# Patient Record
Sex: Male | Born: 1965 | Race: Black or African American | Hispanic: No | Marital: Married | Smoking: Never smoker
Health system: Southern US, Community
[De-identification: ages and names within clinical notes are randomized; demographics above are authoritative.]

## PROBLEM LIST (undated history)

## (undated) DIAGNOSIS — I10 Essential (primary) hypertension: Secondary | ICD-10-CM

## (undated) HISTORY — PX: APPENDECTOMY: SHX54

## (undated) HISTORY — PX: OTHER SURGICAL HISTORY: SHX169

---

## 1999-01-26 ENCOUNTER — Emergency Department (HOSPITAL_COMMUNITY): Admission: EM | Admit: 1999-01-26 | Discharge: 1999-01-26 | Payer: Self-pay | Admitting: Emergency Medicine

## 2002-11-13 ENCOUNTER — Emergency Department (HOSPITAL_COMMUNITY): Admission: EM | Admit: 2002-11-13 | Discharge: 2002-11-13 | Payer: Self-pay | Admitting: Emergency Medicine

## 2002-11-13 ENCOUNTER — Encounter: Payer: Self-pay | Admitting: Emergency Medicine

## 2002-12-03 ENCOUNTER — Encounter: Admission: RE | Admit: 2002-12-03 | Discharge: 2003-01-10 | Payer: Self-pay | Admitting: Occupational Medicine

## 2003-07-02 ENCOUNTER — Emergency Department (HOSPITAL_COMMUNITY): Admission: AD | Admit: 2003-07-02 | Discharge: 2003-07-02 | Payer: Self-pay | Admitting: Emergency Medicine

## 2003-08-13 ENCOUNTER — Encounter: Admission: RE | Admit: 2003-08-13 | Discharge: 2003-08-13 | Payer: Self-pay | Admitting: Orthopaedic Surgery

## 2004-09-17 IMAGING — CT CT RECONSTRUCTION
2 of 6 series · 9 of 20 positions shown, 11 images · IV contrast (omnipaque)
Comparison: none

CLINICAL DATA: History of back pain and right hip pain.
TECHNIQUE: The patient was given extensive informed consent including the risks of pain, infection, and neurologic deficits.  Specifically the risk of infection including discitis and osteomyelitis was discussed with the patient who agreed to proceed.  The patient received 1 gram of Ancef 30 minutes prior to the procedure, and 1 cc of Ancef was also added to the 20 cc of Omnipaque 180 used for the injection.

[Series 102: l-spine helical · axial · 0.27mm/px · z∈[-56,+32]mm · 6 of 101 slices shown, 8 images]
[im 15/101  soft-tissue]
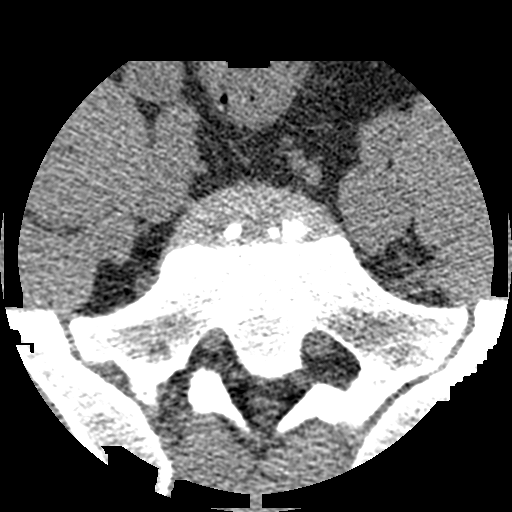
[im 15/101  bone]
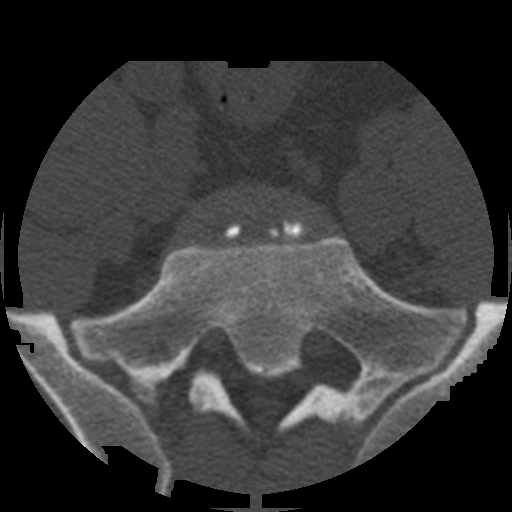
[im 29/101  bone]
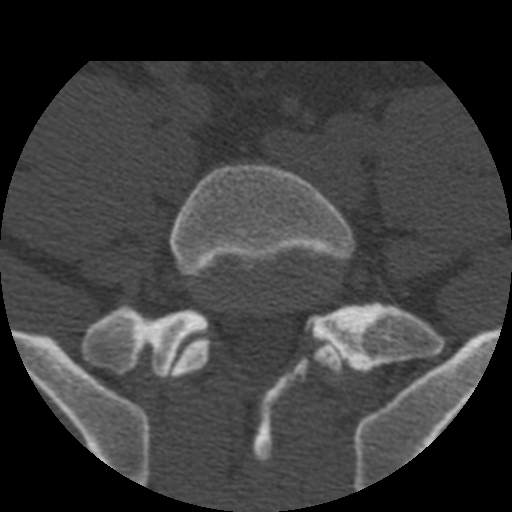
[im 43/101  bone]
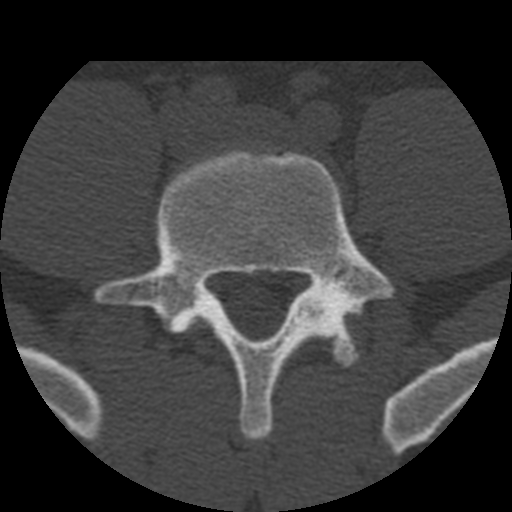
[im 58/101  bone]
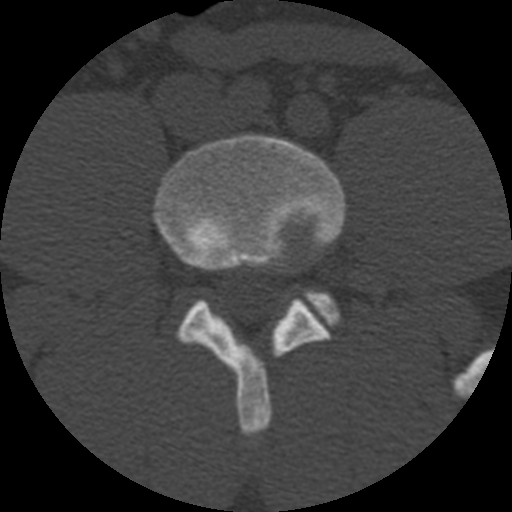
[im 72/101  soft-tissue]
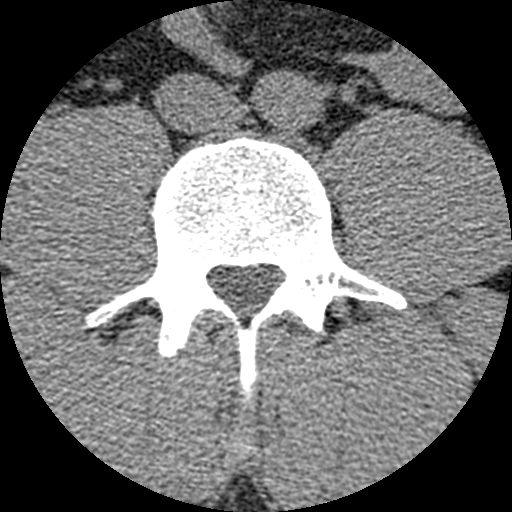
[im 72/101  bone]
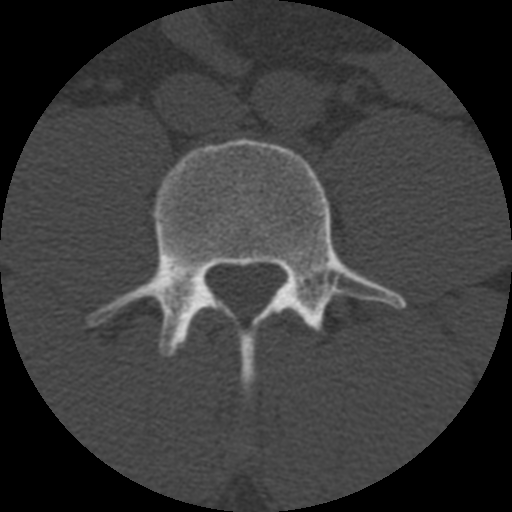
[im 86/101  bone]
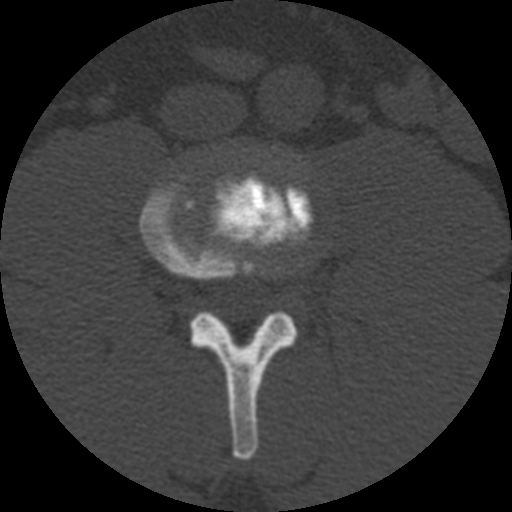

[Series 365: reformatted · sagittal · 0.27mm/px · 3 of 40 slices shown]
[im 8/40  bone]
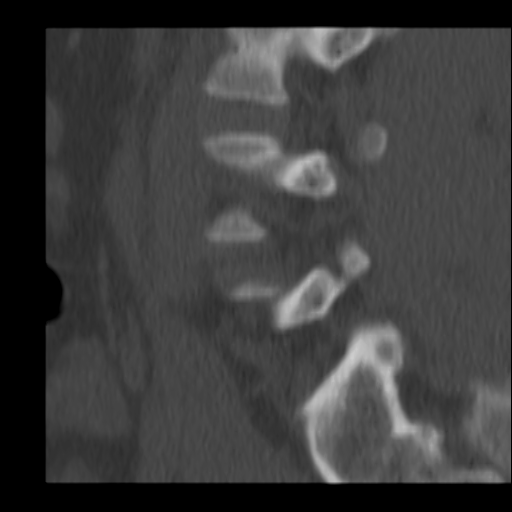
[im 16/40  bone]
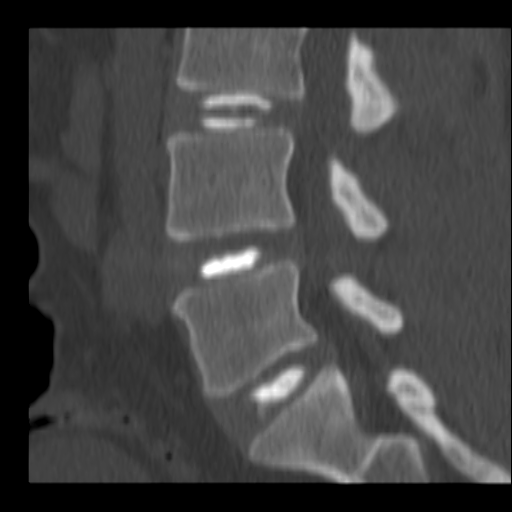
[im 24/40  bone]
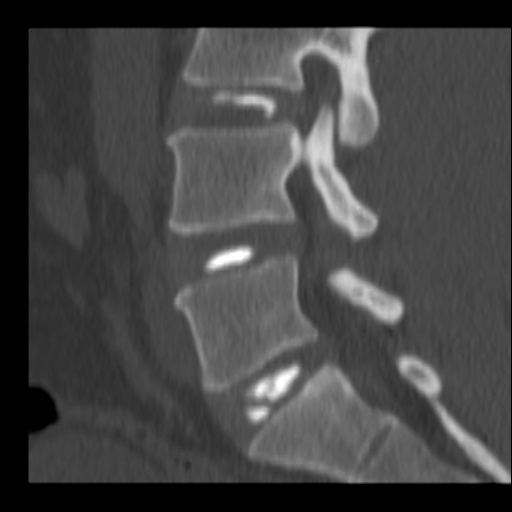

[9 of 20 positions shown; findings below may reference images not displayed]

The patient's back was scrubbed with a sterile scrub sponge for 5 minutes followed by copious application of Betadine solution.  Strict sterile technique was used by everyone in the room including cap, mask, gown, and sterile drapes.  The patient received preprocedure sedation using 2 mg of Versed IV.  A left paraspinous approach was taken with 22 gauge 15 cm Luiyi Foerster using 1% lidocaine.  A transdural approach was necessitated by the patient's anatomy at L5-S1.  Results are as follows:
LUMBAR DISKOGRAMS
L3-4:  Opening pressure 20 PSI.  Pressure at pain response at 60 PSI.  Quantity of contrast 2.5 mL.  Level of pain was 7 on a scale of 1 to 10 with 10 being most severe.  His pain is in the center of his back.  There is more of a pressure sensation and was not familiar to the patient.

L4-5:  Opening pressure 20 PSI.  Pressure at pain response at 60 PSI.  Quantity of contrast 2.5 mL.  Level of pain was 8 on a scale of 1 to 10.  He experienced pressure on the right side of his back similar to that which he experienced at home.

L5-S1:  Opening pressure 40 PSI.  Pressure pain response at 50 PSI.  Quantity of contrast 2.5 mL.  Level of pain was 10 on a scale of 1 to 10.  He experienced pain extending into the right hip beginning in the center of his back.  This was identical to that which he experienced at home.

The discs were all morphologically normal.
POST-DISKOGRAM CT AND MULTIPLANAR RECONSTRUCTION
The lowest three disc spaces are examined.
L3-4:  Normal disc space.
L4-5:  Normal disc space.
L5-S1:  Normal disc space.
IMPRESSION
1.Significant concordant reproduction of the patient's pain at L5-S1 extending to the right hip.
2.Similar, but not quite concordant pain at L4-5.
3.Pressure sensation at L3-4, but no reproduction of the patient's typical pain.
4.Discs all appeared morphologically normal without evidence for annular rent or protrusion of contrast or disc material into the spinal canal.

## 2005-01-07 ENCOUNTER — Emergency Department (HOSPITAL_COMMUNITY): Admission: EM | Admit: 2005-01-07 | Discharge: 2005-01-07 | Payer: Self-pay | Admitting: Emergency Medicine

## 2006-02-22 ENCOUNTER — Emergency Department (HOSPITAL_COMMUNITY): Admission: EM | Admit: 2006-02-22 | Discharge: 2006-02-22 | Payer: Self-pay | Admitting: *Deleted

## 2009-12-20 ENCOUNTER — Emergency Department (HOSPITAL_COMMUNITY): Admission: EM | Admit: 2009-12-20 | Discharge: 2009-12-20 | Payer: Self-pay | Admitting: Family Medicine

## 2010-01-28 ENCOUNTER — Emergency Department (HOSPITAL_BASED_OUTPATIENT_CLINIC_OR_DEPARTMENT_OTHER): Admission: EM | Admit: 2010-01-28 | Discharge: 2010-01-28 | Payer: Self-pay | Admitting: Emergency Medicine

## 2010-01-28 ENCOUNTER — Ambulatory Visit: Payer: Self-pay | Admitting: Radiology

## 2019-12-25 ENCOUNTER — Ambulatory Visit: Payer: Self-pay | Attending: Internal Medicine

## 2019-12-25 DIAGNOSIS — Z20822 Contact with and (suspected) exposure to covid-19: Secondary | ICD-10-CM

## 2019-12-26 LAB — NOVEL CORONAVIRUS, NAA: SARS-CoV-2, NAA: DETECTED — AB

## 2022-08-12 ENCOUNTER — Ambulatory Visit (INDEPENDENT_AMBULATORY_CARE_PROVIDER_SITE_OTHER): Payer: 59 | Admitting: Podiatry

## 2022-08-12 DIAGNOSIS — Q666 Other congenital valgus deformities of feet: Secondary | ICD-10-CM | POA: Diagnosis not present

## 2022-08-12 NOTE — Progress Notes (Unsigned)
  Subjective:  Patient ID: Alexander Mejia, male    DOB: Aug 18, 1966,  MRN: [id number]  Chief Complaint  Patient presents with   Callouses    Right foot underneath 5th toe     56 y.o. male presents with the above complaint.  Patient presents with right submetatarsal 5 porokeratotic lesion painful to touch is progressive gotten worse worse with ambulation hurts with pressure.  He does a lot on his foot.  He has not seen and was prior to seeing me.  He states that he also has flatfoot deformity if he does not wear any kind of orthotics he wears okay shoes.  He wanted to discuss treatment options for that as well   Review of Systems: Negative except as noted in the HPI. Denies N/V/F/Ch.  No past medical history on file.  Current Outpatient Medications:    amLODipine (NORVASC) 10 MG tablet, Take 10 mg by mouth daily., Disp: , Rfl:    calcium carbonate (TUMS - DOSED IN MG ELEMENTAL CALCIUM) 500 MG chewable tablet, Chew by mouth., Disp: , Rfl:   Social History   Tobacco Use  Smoking Status Not on file  Smokeless Tobacco Not on file    Not on File Objective:  There were no vitals filed for this visit. There is no height or weight on file to calculate BMI. Constitutional Well developed. Well nourished.  Vascular Dorsalis pedis pulses palpable bilaterally. Posterior tibial pulses palpable bilaterally. Capillary refill normal to all digits.  No cyanosis or clubbing noted. Pedal hair growth normal.  Neurologic Normal speech. Oriented to person, place, and time. Epicritic sensation to light touch grossly present bilaterally.  Dermatologic Right submetatarsal 5 porokeratotic lesions with underlying plantarflexed fifth metatarsal.  Pain on palpation to the lesion.  Gait examination shows pes planovalgus deformity with calcaneovalgus to many toe signs unable to recruit the arch with dorsiflexion of the hallux.  Orthopedic: Normal joint ROM without pain or crepitus bilaterally. No visible  deformities. No bony tenderness.   Radiographs: None Assessment:   1. Pes planovalgus    Plan:  Patient was evaluated and treated and all questions answered.  Bilateral submetatarsal 5 porokeratosis with underlying pes planovalgus deformity -All questions and concerns were discussed with the patient in extensive detail.  Given the amount of left foot deformity that is present patient will benefit from custom-made orthotics to control ankle motion support the arch of the foot take the pressure away from the arches and heel as well as submetatarsal 5.  I discussed with patient he states understand would like to proceed with getting orthotics -He was casted for orthotics -The lesions were also debrided down using chisel blade to handle.  No pinpoint bleeding noted no complication noted.  No follow-ups on file.  Right submet 5 plantarflexed metatarsal porokeratosis  Pes planovalgus orthotics

## 2022-09-02 ENCOUNTER — Telehealth: Payer: Self-pay | Admitting: Podiatry

## 2022-09-02 NOTE — Telephone Encounter (Signed)
Left message on vm that orthotics are in , if he would like an earlier appt he can give [place] a call back

## 2022-09-30 ENCOUNTER — Ambulatory Visit (INDEPENDENT_AMBULATORY_CARE_PROVIDER_SITE_OTHER): Payer: 59

## 2022-09-30 DIAGNOSIS — Q666 Other congenital valgus deformities of feet: Secondary | ICD-10-CM

## 2022-11-27 NOTE — Progress Notes (Signed)
Patient presents today to pick up custom molded foot orthotics recommended by Dr. Posey Pronto.   Orthotics were dispensed and fit was satisfactory. Reviewed instructions for break-in and wear. Written instructions given to patient.  Patient will follow up as needed.

## 2024-01-24 ENCOUNTER — Other Ambulatory Visit: Payer: Self-pay

## 2024-01-24 ENCOUNTER — Emergency Department (HOSPITAL_COMMUNITY): Payer: Worker's Compensation

## 2024-01-24 ENCOUNTER — Emergency Department (HOSPITAL_COMMUNITY)
Admission: EM | Admit: 2024-01-24 | Discharge: 2024-01-25 | Disposition: A | Discharge status: Home / Self Care | Payer: Worker's Compensation | Attending: Emergency Medicine | Admitting: Emergency Medicine

## 2024-01-24 ENCOUNTER — Encounter (HOSPITAL_COMMUNITY): Payer: Self-pay

## 2024-01-24 DIAGNOSIS — S4991XA Unspecified injury of right shoulder and upper arm, initial encounter: Secondary | ICD-10-CM | POA: Diagnosis present

## 2024-01-24 DIAGNOSIS — Y990 Civilian activity done for income or pay: Secondary | ICD-10-CM | POA: Insufficient documentation

## 2024-01-24 DIAGNOSIS — S4351XA Sprain of right acromioclavicular joint, initial encounter: Secondary | ICD-10-CM | POA: Diagnosis not present

## 2024-01-24 DIAGNOSIS — X501XXA Overexertion from prolonged static or awkward postures, initial encounter: Secondary | ICD-10-CM | POA: Insufficient documentation

## 2024-01-24 NOTE — ED Provider Notes (Signed)
 [place] EMERGENCY DEPARTMENT AT [facility] Provider Note   CSN: [id number] Arrival date & time: 01/24/24  2027     History  Chief Complaint  Patient presents with   Shoulder Injury   Arm Injury    Alexander Mejia is a 58 y.o. male with no past medical history presented with right arm pain after trying to catch an object at work.  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  Patient states since has not been able to move his right arm.  Patient was given fentanyl by EMS which seems to help.  Patient still feel move his hand but does have a shooting pain down his hand.  Patient denies any open wounds but states the pain is in his right arm and sometimes his right elbow but is able to range his right elbow and hand.  Patient does have history of rotator cuff surgery.    Home Medications Prior to Admission medications   Medication Sig Start Date End Date Taking? Authorizing Provider  amLODipine (NORVASC) 10 MG tablet Take 10 mg by mouth daily. 08/05/22   [provider]  calcium carbonate (TUMS - DOSED IN MG ELEMENTAL CALCIUM) 500 MG chewable tablet Chew by mouth.    [provider]      Allergies    Patient has no allergy information on record.    Review of Systems   Review of Systems  Physical Exam Updated Vital Signs Pulse 62   Temp 98 F (36.7 C) (Oral)   Resp 18   Ht 5\' 10"  (1.778 m)   Wt 97.1 kg   SpO2 99%   BMI 30.71 kg/m  Physical Exam Vitals reviewed.  Constitutional:      General: He is not in acute distress. Cardiovascular:     Rate and Rhythm: Normal rate.     Pulses: Normal pulses.  Musculoskeletal:     Comments: Right shoulder: Positive piano sign in the shoulder the [facility] joint, 5 out of 5 right elbow flexion/extension, grip strength, unable to range right shoulder due to pain, no signs of open wounds Pain not out of proportion Soft compartments   Skin:    General: Skin is warm and dry.     Capillary Refill: Capillary refill takes less than 2 seconds.  Neurological:     Mental Status: He is alert.     Comments: Sensation intact distally  Psychiatric:        Mood and Affect: Mood normal.     ED Results / Procedures / Treatments   Labs (all labs ordered are listed, but only abnormal results are displayed) Labs Reviewed - No data to display  EKG None  Radiology DG Shoulder Right Result Date: 01/24/2024 CLINICAL DATA:  Pain, heard a snap. EXAM: RIGHT SHOULDER - 2+ VIEW COMPARISON:  Remote shoulder radiograph 01/18/2010 FINDINGS: No acute fracture. No glenohumeral dislocation. There is widening of the acromioclavicular joint that is new from remote prior exam, acuity uncertain. Subcortical cystic change in the lateral humeral head. Frank erosions. IMPRESSION: 1. Widening of the acromioclavicular joint, acuity uncertain, new from remote 2011 exam. 2. No acute fracture. Electronically Signed   By: Narda Rutherford M.D.   On: 01/24/2024 22:58   DG Elbow Complete Right Result Date: 01/24/2024 CLINICAL DATA:  Pain, heard is snap.  Limited range of motion. EXAM: RIGHT ELBOW - COMPLETE 3+ VIEW COMPARISON:  None Available. FINDINGS: There is no evidence of fracture, dislocation, or joint effusion. Minor degenerative spurring of the coronoid. Soft tissues are unremarkable. IMPRESSION: No fracture or subluxation of the right elbow. Minor degenerative spurring of the coronoid. Electronically Signed   By: Narda Rutherford M.D.   On: 01/24/2024 22:52    Procedures Procedures    Medications Ordered in ED Medications - No data to display  ED Course/ Medical Decision Making/ A&P                                 Medical Decision Making Amount and/or Complexity of Data Reviewed Radiology: ordered.   Alexander Mejia 58 y.o. presented today for right shoulder pain. Working DDx that I considered at this time includes, but not limited to, [facility]  injury, contusion, strain/sprain, fracture, dislocation, neurovascular compromise, septic joint, ischemic limb, compartment syndrome.  R/o DDx: contusion, strain/sprain, fracture, dislocation, neurovascular compromise, septic joint, ischemic limb, compartment syndrome: These are considered less likely due to history of present illness, physical exam, labs/imaging findings.  Review of prior external notes: 05/23/2023 office visit  Unique Tests and My Independent Interpretation:  Right elbow x-ray: No acute pathology Right shoulder x-ray: Widened AC joint  Social Determinants of Health: none  Discussion with Independent Historian:  Wife  Discussion of Management of Tests: None  Risk: Medium: prescription drug management  Risk Stratification Score: None  Plan: On exam patient was no acute distress with stable vitals.  On exam patient does have a positive piano key sign.  Patient it sounds like had a distraction injury in which an object pulled his right arm and so do suspect possible AC injury.  Patient is neuro vastly intact otherwise.  Patient is resting comfortably after the fentanyl was given in by EMS and not requiring any pain meds here.  X-ray by my independent interpretation does show a widened AC joint and so I do feel he has a tear there.  Will give him sling and orthopedic follow-up. I spoke to the patient about RICE therapy including Tylenol every 6 hours needed pain, ice 3-4 times daily for 15 minutes at a time, elevation of extremity.  Work note given.  I spoke to the patient about not lifting heavy objects and waiting until he sees orthopedics to see what the next steps will be.  At time of discharge patient was given sling and pain was well-controlled and compartments are soft.  Patient was given return precautions. Patient stable for discharge at this time.  Patient verbalized understanding of plan.  This chart was dictated using voice recognition software.  Despite best  efforts to proofread,  errors can occur which can change the documentation meaning.         Final Clinical Impression(s) / ED Diagnoses Final diagnoses:  Sprain of right acromioclavicular ligament, initial encounter    Rx / DC Orders ED Discharge Orders     None         Netta Corrigan, PA-C 01/24/24 2340    Franne Forts, DO 01/24/24 2358

## 2024-01-24 NOTE — Discharge Instructions (Signed)
 Please follow-up with the orthopedist have attached here for you today.  Today appears that you have sprained your ACL ligament in your right shoulder causing her pain.  You are given a sling and you may take Tylenol or ibuprofen every 6 hours needed for pain along with ice and follow-up with orthopedics.  Please do not lift any heavy objects in the meantime.  If symptoms change or worsen please return to the ER.

## 2024-01-24 NOTE — ED Triage Notes (Signed)
 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 Hx of rotator cuff surgery.  Med: 200 mcg of fentanyl total. VitalBP 179/110 HR 64; [place] 2L:

## 2024-02-02 ENCOUNTER — Other Ambulatory Visit: Payer: Self-pay | Admitting: Physician Assistant

## 2024-02-02 DIAGNOSIS — S43401A Unspecified sprain of right shoulder joint, initial encounter: Secondary | ICD-10-CM

## 2024-02-04 ENCOUNTER — Ambulatory Visit
Admission: RE | Admit: 2024-02-04 | Discharge: 2024-02-04 | Disposition: A | Discharge status: Home / Self Care | Payer: Worker's Compensation | Source: Ambulatory Visit | Attending: Physician Assistant | Admitting: Physician Assistant

## 2024-02-04 DIAGNOSIS — S43401A Unspecified sprain of right shoulder joint, initial encounter: Secondary | ICD-10-CM

## 2024-03-08 ENCOUNTER — Other Ambulatory Visit (HOSPITAL_COMMUNITY): Payer: Self-pay | Admitting: Family Medicine

## 2024-03-08 DIAGNOSIS — E7800 Pure hypercholesterolemia, unspecified: Secondary | ICD-10-CM

## 2024-03-13 ENCOUNTER — Ambulatory Visit (HOSPITAL_BASED_OUTPATIENT_CLINIC_OR_DEPARTMENT_OTHER)
Admission: RE | Admit: 2024-03-13 | Discharge: 2024-03-13 | Disposition: A | Discharge status: Home / Self Care | Payer: Self-pay | Source: Ambulatory Visit | Attending: Family Medicine | Admitting: Family Medicine

## 2024-03-13 DIAGNOSIS — E7800 Pure hypercholesterolemia, unspecified: Secondary | ICD-10-CM | POA: Insufficient documentation

## 2024-03-22 ENCOUNTER — Other Ambulatory Visit: Payer: Self-pay | Admitting: Orthopedic Surgery

## 2024-03-22 DIAGNOSIS — G8929 Other chronic pain: Secondary | ICD-10-CM

## 2024-04-02 ENCOUNTER — Ambulatory Visit
Admission: RE | Admit: 2024-04-02 | Discharge: 2024-04-02 | Disposition: A | Discharge status: Home / Self Care | Payer: Worker's Compensation | Source: Ambulatory Visit | Attending: Orthopedic Surgery | Admitting: Orthopedic Surgery

## 2024-04-02 DIAGNOSIS — G8929 Other chronic pain: Secondary | ICD-10-CM

## 2024-07-01 NOTE — Progress Notes (Signed)
 Anesthesia Review:  PCP: Alm Rav  Cardiologist :  PPM/ ICD: Device Orders: Rep Notified:  Chest x-ray : EKG : CT Card-04/09/24  Echo : Stress test: Cardiac Cath :   Activity level:  Sleep Study/ CPAP : Fasting Blood Sugar :      / Checks Blood Sugar -- times a day:    Blood Thinner/ Instructions /Last Dose: ASA / Instructions/ Last Dose :

## 2024-07-01 NOTE — Patient Instructions (Signed)
 SURGICAL WAITING ROOM VISITATION  Patients having surgery or a procedure may have no more than 2 support people in the waiting area - these visitors may rotate.    Children under the age of 3 must have an adult with them who is not the patient.  Visitors with respiratory illnesses are discouraged from visiting and should remain at home.  If the patient needs to stay at the hospital during part of their recovery, the visitor guidelines for inpatient rooms apply. Pre-op nurse will coordinate an appropriate time for 1 support person to accompany patient in pre-op.  This support person may not rotate.    Please refer to the [facility] website for the visitor guidelines for Inpatients (after your surgery is over and you are in a regular room).       Your procedure is scheduled on:  07/12/24    Report to [facility] Main Entrance    Report to admitting at  1030 AM   Call this number if you have problems the morning of surgery [phone]   Do not eat food :After Midnight.   After Midnight you may have the following liquids until __  1000____ AMDAY OF SURGERY  Water Non-Citrus Juices (without pulp, NO RED-Apple, White grape, White cranberry) Black Coffee (NO MILK/CREAM OR CREAMERS, sugar ok)  Clear Tea (NO MILK/CREAM OR CREAMERS, sugar ok) regular and decaf                             Plain Jell-O (NO RED)                                           Fruit ices (not with fruit pulp, NO RED)                                     Popsicles (NO RED)                                                               Sports drinks like Gatorade (       The day of surgery:  Drink ONE (1) Pre-Surgery Clear Ensure or G2 at   1000AM the morning of surgery. Drink in one sitting. Do not sip.  This drink was given to you during your hospital  pre-op appointment visit. Nothing else to drink after completing the  Pre-Surgery Clear Ensure or G2.          If you have questions, please contact  your surgeon's office.       Oral Hygiene is also important to reduce your risk of infection.                                    Remember - BRUSH YOUR TEETH THE MORNING OF SURGERY WITH YOUR REGULAR TOOTHPASTE  DENTURES WILL BE REMOVED PRIOR TO SURGERY PLEASE DO NOT APPLY Poly grip OR ADHESIVES!!!   Do NOT smoke after Midnight   Stop all vitamins and  herbal supplements 7 days before surgery.   Take these medicines the morning of surgery with A SIP OF WATER:  amlodipine   DO NOT TAKE ANY ORAL DIABETIC MEDICATIONS DAY OF YOUR SURGERY  Bring CPAP mask and tubing day of surgery.                              You may not have any metal on your body including hair pins, jewelry, and body piercing             Do not wear make-up, lotions, powders, perfumes/cologne, or deodorant  Do not wear nail polish including gel and S&S, artificial/acrylic nails, or any other type of covering on natural nails including finger and toenails. If you have artificial nails, gel coating, etc. that needs to be removed by a nail salon please have this removed prior to surgery or surgery may need to be canceled/ delayed if the surgeon/ anesthesia feels like they are unable to be safely monitored.   Do not shave  48 hours prior to surgery.               Men may shave face and neck.   Do not bring valuables to the hospital. [facility] IS NOT             RESPONSIBLE   FOR VALUABLES.   Contacts, glasses, dentures or bridgework may not be worn into surgery.   Bring small overnight bag day of surgery.   DO NOT BRING YOUR HOME MEDICATIONS TO THE HOSPITAL. PHARMACY WILL DISPENSE MEDICATIONS LISTED ON YOUR MEDICATION LIST TO YOU DURING YOUR ADMISSION IN THE HOSPITAL!    Patients discharged on the day of surgery will not be allowed to drive home.  Someone NEEDS to stay with you for the first 24 hours after anesthesia.   Special Instructions: Bring a copy of your healthcare power of attorney and living will  documents the day of surgery if you haven't scanned them before.              Please read over the following fact sheets you were given: IF YOU HAVE QUESTIONS ABOUT YOUR PRE-OP INSTRUCTIONS PLEASE CALL [phone].   If you received a COVID test during your pre-op visit  it is requested that you wear a mask when out in public, stay away from anyone that may not be feeling well and notify your surgeon if you develop symptoms. If you test positive for Covid or have been in contact with anyone that has tested positive in the last 10 days please notify you surgeon.      Pre-operative 5 CHG Bath Instructions   You can play a key role in reducing the risk of infection after surgery. Your skin needs to be as free of germs as possible. You can reduce the number of germs on your skin by washing with CHG (chlorhexidine gluconate) soap before surgery. CHG is an antiseptic soap that kills germs and continues to kill germs even after washing.   DO NOT use if you have an allergy to chlorhexidine/CHG or antibacterial soaps. If your skin becomes reddened or irritated, stop using the CHG and notify one of our RNs at [phone].   Please shower with the CHG soap starting 4 days before surgery using the following schedule:     Please keep in mind the following:  DO NOT shave, including legs and underarms, starting the day of your first  shower.   You may shave your face at any point before/day of surgery.  Place clean sheets on your bed the day you start using CHG soap. Use a clean washcloth (not used since being washed) for each shower. DO NOT sleep with pets once you start using the CHG.   CHG Shower Instructions:  If you choose to wash your hair and private area, wash first with your normal shampoo/soap.  After you use shampoo/soap, rinse your hair and body thoroughly to remove shampoo/soap residue.  Turn the water OFF and apply about 3 tablespoons (45 ml) of CHG soap to a CLEAN washcloth.  Apply CHG  soap ONLY FROM YOUR NECK DOWN TO YOUR TOES (washing for 3-5 minutes)  DO NOT use CHG soap on face, private areas, open wounds, or sores.  Pay special attention to the area where your surgery is being performed.  If you are having back surgery, having someone wash your back for you may be helpful. Wait 2 minutes after CHG soap is applied, then you may rinse off the CHG soap.  Pat dry with a clean towel  Put on clean clothes/pajamas   If you choose to wear lotion, please use ONLY the CHG-compatible lotions on the back of this paper.     Additional instructions for the day of surgery: DO NOT APPLY any lotions, deodorants, cologne, or perfumes.   Put on clean/comfortable clothes.  Brush your teeth.  Ask your nurse before applying any prescription medications to the skin.      CHG Compatible Lotions   Aveeno Moisturizing lotion  Cetaphil Moisturizing Cream  Cetaphil Moisturizing Lotion  Clairol Herbal Essence Moisturizing Lotion, Dry Skin  Clairol Herbal Essence Moisturizing Lotion, Extra Dry Skin  Clairol Herbal Essence Moisturizing Lotion, Normal Skin  Curel Age Defying Therapeutic Moisturizing Lotion with Alpha Hydroxy  Curel Extreme Care Body Lotion  Curel Soothing Hands Moisturizing Hand Lotion  Curel Therapeutic Moisturizing Cream, Fragrance-Free  Curel Therapeutic Moisturizing Lotion, Fragrance-Free  Curel Therapeutic Moisturizing Lotion, Original Formula  Eucerin Daily Replenishing Lotion  Eucerin Dry Skin Therapy Plus Alpha Hydroxy Crme  Eucerin Dry Skin Therapy Plus Alpha Hydroxy Lotion  Eucerin Original Crme  Eucerin Original Lotion  Eucerin Plus Crme Eucerin Plus Lotion  Eucerin TriLipid Replenishing Lotion  Keri Anti-Bacterial Hand Lotion  Keri Deep Conditioning Original Lotion Dry Skin Formula Softly Scented  Keri Deep Conditioning Original Lotion, Fragrance Free Sensitive Skin Formula  Keri Lotion Fast Absorbing Fragrance Free Sensitive Skin Formula  Keri  Lotion Fast Absorbing Softly Scented Dry Skin Formula  Keri Original Lotion  Keri Skin Renewal Lotion Keri Silky Smooth Lotion  Keri Silky Smooth Sensitive Skin Lotion  Nivea Body Creamy Conditioning Oil  Nivea Body Extra Enriched Lotion  Nivea Body Original Lotion  Nivea Body Sheer Moisturizing Lotion Nivea Crme  Nivea Skin Firming Lotion  NutraDerm 30 Skin Lotion  NutraDerm Skin Lotion  NutraDerm Therapeutic Skin Cream  NutraDerm Therapeutic Skin Lotion  ProShield Protective Hand Cream  Provon moisturizing lotion  [place]- Preparing for Total Shoulder Arthroplasty    Before surgery, you can play an important role. Because skin is not sterile, your skin needs to be as free of germs as possible. You can reduce the number of germs on your skin by using the following products. Benzoyl Peroxide Gel Reduces the number of germs present on the skin Applied twice a day to shoulder area starting two days before surgery    ==================================================================  Please follow these instructions carefully:  BENZOYL PEROXIDE 5% GEL  Please do not use if you have an allergy to benzoyl peroxide.   If your skin becomes reddened/irritated stop using the benzoyl peroxide.  Starting two days before surgery, apply as follows: Apply benzoyl peroxide in the morning and at night. Apply after taking a shower. If you are not taking a shower clean entire shoulder front, back, and side along with the armpit with a clean wet washcloth.  Place a quarter-sized dollop on your shoulder and rub in thoroughly, making sure to cover the front, back, and side of your shoulder, along with the armpit.   2 days before ____ AM   ____ PM              1 day before ____ AM   ____ PM                         Do this twice a day for two days.  (Last application is the night before surgery, AFTER using the CHG soap as described below).  Do NOT apply benzoyl peroxide gel on the day of  surgery.

## 2024-07-03 ENCOUNTER — Encounter (HOSPITAL_COMMUNITY): Payer: Self-pay

## 2024-07-03 ENCOUNTER — Other Ambulatory Visit: Payer: Self-pay

## 2024-07-03 ENCOUNTER — Encounter (HOSPITAL_COMMUNITY)
Admission: RE | Admit: 2024-07-03 | Discharge: 2024-07-03 | Disposition: A | Discharge status: Home / Self Care | Payer: Worker's Compensation | Source: Ambulatory Visit | Attending: Orthopedic Surgery | Admitting: Orthopedic Surgery

## 2024-07-03 VITALS — BP 149/74 | HR 70 | Temp 98.4°F | Resp 16 | Ht 70.0 in | Wt 212.0 lb

## 2024-07-03 DIAGNOSIS — Z01818 Encounter for other preprocedural examination: Secondary | ICD-10-CM | POA: Diagnosis not present

## 2024-07-03 DIAGNOSIS — Z01810 Encounter for preprocedural cardiovascular examination: Secondary | ICD-10-CM | POA: Diagnosis present

## 2024-07-03 DIAGNOSIS — Z01812 Encounter for preprocedural laboratory examination: Secondary | ICD-10-CM | POA: Diagnosis present

## 2024-07-03 HISTORY — DX: Essential (primary) hypertension: I10

## 2024-07-03 LAB — BASIC METABOLIC PANEL WITH GFR
Anion gap: 13 (ref 5–15)
BUN: 15 mg/dL (ref 6–20)
CO2: 23 mmol/L (ref 22–32)
Calcium: 10.3 mg/dL (ref 8.9–10.3)
Chloride: 103 mmol/L (ref 98–111)
Creatinine, Ser: 0.87 mg/dL (ref 0.61–1.24)
GFR, Estimated: >60 mL/min (ref >60)
Glucose, Bld: 127 mg/dL — ABNORMAL HIGH (ref 70–99)
Potassium: 4.4 mmol/L (ref 3.5–5.1)
Sodium: 139 mmol/L (ref 135–145)

## 2024-07-03 LAB — CBC
HCT: 45.2 % (ref 39.0–52.0)
Hemoglobin: 14.3 g/dL (ref 13.0–17.0)
MCH: 27.2 pg (ref 26.0–34.0)
MCHC: 31.6 g/dL (ref 30.0–36.0)
MCV: 85.9 fL (ref 80.0–100.0)
Platelets: 336 K/uL (ref 150–400)
RBC: 5.26 MIL/uL (ref 4.22–5.81)
RDW: 12.6 % (ref 11.5–15.5)
WBC: 6.4 K/uL (ref 4.0–10.5)
nRBC: 0 % (ref 0.0–0.2)

## 2024-07-03 LAB — SURGICAL PCR SCREEN
MRSA, PCR: NEGATIVE
Staphylococcus aureus: NEGATIVE

## 2024-07-12 ENCOUNTER — Ambulatory Visit (HOSPITAL_COMMUNITY): Payer: Worker's Compensation | Admitting: Physician Assistant

## 2024-07-12 ENCOUNTER — Ambulatory Visit (HOSPITAL_COMMUNITY): Payer: Worker's Compensation | Admitting: Certified Registered Nurse Anesthetist

## 2024-07-12 ENCOUNTER — Encounter (HOSPITAL_COMMUNITY)
Admission: RE | Disposition: A | Discharge status: Home / Self Care | Payer: Self-pay | Source: Home / Self Care | Attending: Orthopedic Surgery

## 2024-07-12 ENCOUNTER — Ambulatory Visit (HOSPITAL_COMMUNITY)
Admission: RE | Admit: 2024-07-12 | Discharge: 2024-07-12 | Disposition: A | Discharge status: Home / Self Care | Payer: Worker's Compensation | Attending: Orthopedic Surgery | Admitting: Orthopedic Surgery

## 2024-07-12 ENCOUNTER — Other Ambulatory Visit: Payer: Self-pay

## 2024-07-12 ENCOUNTER — Encounter (HOSPITAL_COMMUNITY): Payer: Self-pay | Admitting: Orthopedic Surgery

## 2024-07-12 DIAGNOSIS — M19011 Primary osteoarthritis, right shoulder: Secondary | ICD-10-CM

## 2024-07-12 DIAGNOSIS — Z01818 Encounter for other preprocedural examination: Secondary | ICD-10-CM

## 2024-07-12 DIAGNOSIS — I10 Essential (primary) hypertension: Secondary | ICD-10-CM | POA: Diagnosis not present

## 2024-07-12 HISTORY — PX: SHOULDER HEMI-ARTHROPLASTY: SHX5049

## 2024-07-12 SURGERY — HEMIARTHROPLASTY, SHOULDER
Anesthesia: Regional | Site: Shoulder | Laterality: Right

## 2024-07-12 MED ORDER — OXYCODONE HCL 5 MG PO TABS
ORAL_TABLET | ORAL | Status: AC
  Filled 2024-07-12: qty 1

## 2024-07-12 MED ORDER — LACTATED RINGERS IV SOLN: INTRAVENOUS | Status: DC

## 2024-07-12 MED ORDER — STERILE WATER FOR IRRIGATION IR SOLN
Status: DC | PRN
  Administered 2024-07-12: 1000 mL

## 2024-07-12 MED ORDER — ONDANSETRON 4 MG PO TBDP: 4.0000 mg | ORAL_TABLET | Freq: Three times a day (TID) | ORAL | 0 refills | Status: AC | PRN

## 2024-07-12 MED ORDER — TRANEXAMIC ACID-NACL 1000-0.7 MG/100ML-% IV SOLN
1000.0000 mg | INTRAVENOUS | Status: AC
  Administered 2024-07-12: 1000 mg via INTRAVENOUS
  Filled 2024-07-12: qty 100

## 2024-07-12 MED ORDER — ACETAMINOPHEN 10 MG/ML IV SOLN
INTRAVENOUS | Status: AC
  Filled 2024-07-12: qty 100

## 2024-07-12 MED ORDER — CEFAZOLIN SODIUM-DEXTROSE 2-4 GM/100ML-% IV SOLN
2.0000 g | INTRAVENOUS | Status: AC
  Administered 2024-07-12: 2 g via INTRAVENOUS
  Filled 2024-07-12: qty 100

## 2024-07-12 MED ORDER — 0.9 % SODIUM CHLORIDE (POUR BTL) OPTIME
TOPICAL | Status: DC | PRN
  Administered 2024-07-12: 1000 mL

## 2024-07-12 MED ORDER — ISOPROPYL ALCOHOL 70 % SOLN
Status: DC | PRN
  Administered 2024-07-12: 1 via TOPICAL

## 2024-07-12 MED ORDER — OXYCODONE HCL 5 MG PO TABS: 5.0000 mg | ORAL_TABLET | ORAL | 0 refills | Status: AC | PRN

## 2024-07-12 MED ORDER — SUGAMMADEX SODIUM 200 MG/2ML IV SOLN
INTRAVENOUS | Status: DC | PRN
  Administered 2024-07-12: 200 mg via INTRAVENOUS

## 2024-07-12 MED ORDER — PHENYLEPHRINE HCL-NACL 20-0.9 MG/250ML-% IV SOLN
INTRAVENOUS | Status: DC | PRN
  Administered 2024-07-12: 30 ug/min via INTRAVENOUS

## 2024-07-12 MED ORDER — PROPOFOL 10 MG/ML IV BOLUS
INTRAVENOUS | Status: DC | PRN
Start: 2024-07-12 — End: 2024-07-12
  Administered 2024-07-12: 160 mg via INTRAVENOUS

## 2024-07-12 MED ORDER — FENTANYL CITRATE PF 50 MCG/ML IJ SOSY
50.0000 ug | PREFILLED_SYRINGE | INTRAMUSCULAR | Status: DC
  Administered 2024-07-12: 50 ug via INTRAVENOUS
  Filled 2024-07-12: qty 2

## 2024-07-12 MED ORDER — ACETAMINOPHEN 10 MG/ML IV SOLN
1000.0000 mg | Freq: Once | INTRAVENOUS | Status: DC | PRN
  Administered 2024-07-12: 1000 mg via INTRAVENOUS

## 2024-07-12 MED ORDER — LIDOCAINE HCL (PF) 2 % IJ SOLN
INTRAMUSCULAR | Status: DC | PRN
  Administered 2024-07-12: 80 mg via INTRADERMAL

## 2024-07-12 MED ORDER — ROCURONIUM BROMIDE 10 MG/ML (PF) SYRINGE
PREFILLED_SYRINGE | INTRAVENOUS | Status: DC | PRN
  Administered 2024-07-12: 60 mg via INTRAVENOUS

## 2024-07-12 MED ORDER — VANCOMYCIN HCL 1000 MG IV SOLR
INTRAVENOUS | Status: DC | PRN
  Administered 2024-07-12: 1000 mg via TOPICAL

## 2024-07-12 MED ORDER — BUPIVACAINE HCL (PF) 0.5 % IJ SOLN
INTRAMUSCULAR | Status: DC | PRN
  Administered 2024-07-12: 10 mL via PERINEURAL

## 2024-07-12 MED ORDER — PHENYLEPHRINE 80 MCG/ML (10ML) SYRINGE FOR IV PUSH (FOR BLOOD PRESSURE SUPPORT)
PREFILLED_SYRINGE | INTRAVENOUS | Status: DC | PRN
  Administered 2024-07-12: 120 ug via INTRAVENOUS
  Administered 2024-07-12: 80 ug via INTRAVENOUS

## 2024-07-12 MED ORDER — CHLORHEXIDINE GLUCONATE 0.12 % MT SOLN
15.0000 mL | Freq: Once | OROMUCOSAL | Status: AC
  Administered 2024-07-12: 15 mL via OROMUCOSAL

## 2024-07-12 MED ORDER — FENTANYL CITRATE PF 50 MCG/ML IJ SOSY
25.0000 ug | PREFILLED_SYRINGE | INTRAMUSCULAR | Status: DC | PRN
  Administered 2024-07-12: 50 ug via INTRAVENOUS

## 2024-07-12 MED ORDER — ORAL CARE MOUTH RINSE: 15.0000 mL | Freq: Once | OROMUCOSAL | Status: AC

## 2024-07-12 MED ORDER — ONDANSETRON HCL 4 MG/2ML IJ SOLN
INTRAMUSCULAR | Status: DC | PRN
  Administered 2024-07-12: 4 mg via INTRAVENOUS

## 2024-07-12 MED ORDER — OXYCODONE HCL 5 MG PO TABS
5.0000 mg | ORAL_TABLET | Freq: Once | ORAL | Status: AC
  Administered 2024-07-12: 5 mg via ORAL

## 2024-07-12 MED ORDER — MIDAZOLAM HCL 2 MG/2ML IJ SOLN
1.0000 mg | INTRAMUSCULAR | Status: DC
  Administered 2024-07-12: 2 mg via INTRAVENOUS
  Filled 2024-07-12: qty 2

## 2024-07-12 MED ORDER — BUPIVACAINE LIPOSOME 1.3 % IJ SUSP
INTRAMUSCULAR | Status: DC | PRN
  Administered 2024-07-12: 10 mL via PERINEURAL

## 2024-07-12 MED ORDER — VANCOMYCIN HCL 1000 MG IV SOLR
INTRAVENOUS | Status: AC
Start: 2024-07-12 — End: 2024-07-12
  Filled 2024-07-12: qty 20

## 2024-07-12 MED ORDER — DEXAMETHASONE SODIUM PHOSPHATE 4 MG/ML IJ SOLN
INTRAMUSCULAR | Status: DC | PRN
  Administered 2024-07-12: 5 mg via INTRAVENOUS

## 2024-07-12 MED ORDER — FENTANYL CITRATE PF 50 MCG/ML IJ SOSY
PREFILLED_SYRINGE | INTRAMUSCULAR | Status: AC
  Filled 2024-07-12: qty 1

## 2024-07-12 SURGICAL SUPPLY — 47 items
ANCHOR PEEK 4.75X19.1 SWLK C (Anchor) IMPLANT
BAG COUNTER SPONGE SURGICOUNT (BAG) IMPLANT
BAG ZIPLOCK 12X15 (MISCELLANEOUS) ×2 IMPLANT
BLADE SAG 18X100X1.27 (BLADE) ×2 IMPLANT
BODY TRUNION ECLIPSE 47 SL (Shoulder) IMPLANT
CALIBRATOR GLENOID VIP 5-D (SYSTAGENIX WOUND MANAGEMENT) IMPLANT
CEMENT BONE DEPUY (Cement) ×2 IMPLANT
COOLER ICEMAN CLASSIC (MISCELLANEOUS) ×2 IMPLANT
COVER BACK TABLE 60X90IN (DRAPES) ×2 IMPLANT
COVER SURGICAL LIGHT HANDLE (MISCELLANEOUS) ×2 IMPLANT
DRAPE POUCH INSTRU U-SHP 10X18 (DRAPES) ×2 IMPLANT
DRAPE SHEET LG 3/4 BI-LAMINATE (DRAPES) ×2 IMPLANT
DRAPE SURG 17X11 SM STRL (DRAPES) ×2 IMPLANT
DRAPE SURG ORHT 6 SPLT 77X108 (DRAPES) ×4 IMPLANT
DRAPE TOP 10253 STERILE (DRAPES) ×2 IMPLANT
DRAPE U-SHAPE 47X51 STRL (DRAPES) ×2 IMPLANT
DRSG AQUACEL AG ADV 3.5X 6 (GAUZE/BANDAGES/DRESSINGS) IMPLANT
DRSG AQUACEL AG ADV 3.5X10 (GAUZE/BANDAGES/DRESSINGS) IMPLANT
DURAPREP 26ML APPLICATOR (WOUND CARE) ×2 IMPLANT
ELECT PENCIL ROCKER SW 15FT (MISCELLANEOUS) ×2 IMPLANT
ELECT REM PT RETURN 15FT ADLT (MISCELLANEOUS) ×2 IMPLANT
FACESHIELD WRAPAROUND OR TEAM (MASK) ×2 IMPLANT
GLENOID WITH CLEAT MEDIUM (Shoulder) IMPLANT
GLOVE BIO SURGEON STRL SZ7.5 (GLOVE) ×8 IMPLANT
GLOVE BIOGEL PI IND STRL 8 (GLOVE) ×4 IMPLANT
GOWN STRL REUS W/ TWL XL LVL3 (GOWN DISPOSABLE) ×4 IMPLANT
HEAD HUMERAL ECLIPSE 47/20 (Shoulder) IMPLANT
IMPL ECLIPSE SPEEDCAP (Shoulder) IMPLANT
KIT TURNOVER KIT A (KITS) ×2 IMPLANT
MANIFOLD NEPTUNE II (INSTRUMENTS) ×2 IMPLANT
PACK SHOULDER (CUSTOM PROCEDURE TRAY) ×2 IMPLANT
PAD COLD SHLDR WRAP-ON (PAD) ×2 IMPLANT
PIN NITINOL TARGETER 2.8 (PIN) IMPLANT
RESTRAINT HEAD UNIVERSAL NS (MISCELLANEOUS) ×2 IMPLANT
SCREW MED ECLIPSE 35 (Screw) IMPLANT
SIZER ECLIPSE CAGE SCREW (ORTHOPEDIC DISPOSABLE SUPPLIES) IMPLANT
STRIP CLOSURE SKIN 1/2X4 (GAUZE/BANDAGES/DRESSINGS) ×2 IMPLANT
SUCTION TUBE FRAZIER 12FR DISP (SUCTIONS) ×2 IMPLANT
SUT MNCRL AB 3-0 PS2 18 (SUTURE) ×2 IMPLANT
SUT MON AB 2-0 CT1 36 (SUTURE) ×2 IMPLANT
SUT VIC AB 0 CT1 36 (SUTURE) ×2 IMPLANT
SUT VIC AB 1 CT1 36 (SUTURE) ×2 IMPLANT
SUTURE TAPE 1.3 40 TPR END (SUTURE) ×2 IMPLANT
TOWEL GREEN STERILE FF (TOWEL DISPOSABLE) ×2 IMPLANT
TOWEL OR 17X26 10 PK STRL BLUE (TOWEL DISPOSABLE) ×2 IMPLANT
TOWER SMARTMIX MINI (MISCELLANEOUS) ×2 IMPLANT
TUBE SUCTION HIGH CAP CLEAR NV (SUCTIONS) ×2 IMPLANT

## 2024-07-12 NOTE — Anesthesia Preprocedure Evaluation (Signed)
 Anesthesia Evaluation  Patient identified by MRN, date of birth, ID band Patient awake    Reviewed: Allergy & Precautions, NPO status , Patient's Chart, lab work & pertinent test results  Airway Mallampati: II  TM Distance: >3 FB Neck ROM: Full    Dental no notable dental hx.    Pulmonary neg pulmonary ROS   Pulmonary exam normal        Cardiovascular hypertension, Pt. on medications  Rhythm:Regular Rate:Normal     Neuro/Psych negative neurological ROS  negative psych ROS   GI/Hepatic negative GI ROS, Neg liver ROS,,,  Endo/Other  negative endocrine ROS    Renal/GU negative Renal ROS  negative genitourinary   Musculoskeletal  (+) Arthritis , Osteoarthritis,    Abdominal Normal abdominal exam  (+)   Peds  Hematology Lab Results      Component                Value               Date                      WBC                      6.4                 07/03/2024                HGB                      14.3                07/03/2024                HCT                      45.2                07/03/2024                MCV                      85.9                07/03/2024                PLT                      336                 07/03/2024             Lab Results      Component                Value               Date                      NA                       139                 07/03/2024                K  4.4                 07/03/2024                CO2                      23                  07/03/2024                GLUCOSE                  127 (H)             07/03/2024                BUN                      15                  07/03/2024                CREATININE               0.87                07/03/2024                CALCIUM                  10.3                07/03/2024                GFRNONAA                 >60                 07/03/2024              Anesthesia Other  Findings   Reproductive/Obstetrics                              Anesthesia Physical Anesthesia Plan  ASA: 2  Anesthesia Plan: General and Regional   Post-op Pain Management: Regional block*   Induction: Intravenous  PONV Risk Score and Plan: 2 and Ondansetron , Dexamethasone , Midazolam  and Treatment may vary due to age or medical condition  Airway Management Planned: Mask and Oral ETT  Additional Equipment: None  Intra-op Plan:   Post-operative Plan: Extubation in OR  Informed Consent: I have reviewed the patients History and Physical, chart, labs and discussed the procedure including the risks, benefits and alternatives for the proposed anesthesia with the patient or authorized representative who has indicated his/her understanding and acceptance.     Dental advisory given  Plan Discussed with: CRNA  Anesthesia Plan Comments:         Anesthesia Quick Evaluation

## 2024-07-12 NOTE — Anesthesia Procedure Notes (Addendum)
 Anesthesia Regional Block: Interscalene brachial plexus block   Pre-Anesthetic Checklist: , timeout performed,  Correct Patient, Correct Site, Correct Laterality,  Correct Procedure, Correct Position, site marked,  Risks and benefits discussed,  Surgical consent,  Pre-op evaluation,  At surgeon's request and post-op pain management  Laterality: Right  Prep: Dura Prep       Needles:  Injection technique: Single-shot  Needle Type: Echogenic Stimulator Needle     Needle Length: 5cm  Needle Gauge: 20     Additional Needles:   Procedures:,,,, ultrasound used (permanent image in chart),,    Narrative:  Start time: 07/12/2024 11:50 AM End time: 07/12/2024 11:55 AM Injection made incrementally with aspirations every 5 mL.  Performed by: Personally  Anesthesiologist: Dorethea Cordella SQUIBB, DO  Additional Notes: Patient identified. Risks/Benefits/Options discussed with patient including but not limited to bleeding, infection, nerve damage, failed block, incomplete pain control. Patient expressed understanding and wished to proceed. All questions were answered. Sterile technique was used throughout the entire procedure. Please see nursing notes for vital signs. Aspirated in 5cc intervals with injection for negative confirmation. Patient was given instructions on fall risk and not to get out of bed. All questions and concerns addressed with instructions to call with any issues or inadequate analgesia.

## 2024-07-12 NOTE — Op Note (Signed)
 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  PREOPERATIVE DIAGNOSIS:  1 end-stage osteoarthritis right shoulder  POSTOPERATIVE DIAGNOSIS: Same.  PROCEDURE: Hemiarthroplasty right shoulder  SURGEON: Selinda SHAUNNA Gosling, M.D.  ASSIST: Dayle Moores, PA-C  Assistant attestation:  PA McClung scrubbed and present for the entire procedure..  ANESTHESIA:  general, interscalene with Exparel   IV FLUIDS AND URINE: See anesthesia.  ESTIMATED BLOOD LOSS: 150 mL.  IMPLANTS: Arthrex Eclipse humeral head size 47 x 20 with a size 47 trunnion and a medium cage screw.  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  We did consider prepping for a glenoid and found that this was likely not in his best interest due to the high incidence of glenoid failure and a B3 glenoid.  Therefore elected to go ahead with a hemiarthroplasty with a ream and run technique where the glenoid surface was reamed to prepare for some postoperative pain relief.  He had a nicely centered hemiarthroplasty at the conclusion of the procedure.  DESCRIPTION OF PROCEDURE: After obtaining informed consent,The patient was brought to the operating room and placed supine on the operating table.  The patient had been signed prior to the procedure and this was documented. The patient had the anesthesia placed by the anesthesiologist.  A time-out was performed to confirm that this was the correct patient, site, side and location. The patient did receive antibiotics prior to the incision and was re-dosed during the procedure as needed at  indicated intervals.   The patient had the operative extremity prepped and draped in the standard surgical fashion.   The head and neck were nicely stabilized.   A 10 blade was used to make a standard deltopectoral approach to the shoulder.  Dissection was carried out through the subcutaneous tissue to where the deltopectoral interval was visualized and developed.  The cephalic vein was mobilized and retracted laterally for the duration of the case.  The subacromial space was cleared bluntly retractors were placed appropriately deep to the pectoralis major tendon and deep to the deltoid muscle fascia.  The upper one third of the pectoralis muscle insertion on the humerus was sharply released.  There was found to be a previous moderate amount of subdeltoid adhesion from previous open surgery.  There was obviously noted to be a previous biceps tenodesis high in the biceps groove.  Next I performed a subscapularis takedown and a peel fashion.  Subscapularis, rotator interval, and the inferior capsule were all released.  There were inferior humeral osteophytes that were removed with rongeur and curved osteotome.  After, Releases, a humeral head osteotomy was performed in 30 of retroversion and at 135 head neck angle.  A protractor was placed within the glenoid vault to protect axillary nerve and posterior capsular structures.  Next we prepared the humerus.  We found this to be appropriate for the 47 trunnion.  We also prepared the central chisel for the medial cage screw.  Retractors were placed anteriorly, superiorly, and posteriorly and the labrum was excised.  Exposure of the glenoid was obtained.  At this juncture we noted that there was significant retroversion and really  the humeral head sat greater than 70% posteriorly subluxed in the resting position.  This was truly a B3 glenoid.  We were able to get a central pin in appropriate position and ream the high side.  We then continue to inspect closely if  this was appropriate for a poly liner.  We did prepare for a medium size poly.  However, we were not satisfied with the fixation due to the significant retroversion and B3 glenoid.  We felt this was a high risk scenario for glenoid loosening, in fact the polyethylene did appear to not be well-seated so we converted this to a hemiarthroplasty to prevent future failure in a rapid fashion.  We then turned our attention back to the humeral side.  We placed the trunnion which was a size 47 with a mallet.  We then placed the medium cage screw which was packed with humeral head autograft.  This had excellent fixation.  We then trialed humeral heads and found the 47 x 20 mm humeral head to be satisfactory.  This demonstrated posterior pushback of just at 50% with spontaneous reduction and likewise inferior pull down of 50% with spontaneous reduction.  That head was then removed and the final humeral head was malleted in place with the above size.   Lastly we turned our attention to the subscapularis repair.  The shoulder was copiously irrigated once again with antibiotic solution.  We utilized the [employer] repair set with the arm in neutral rotation in slight abduction and resting on the Mayo stand.  We placed 3 knotless anchors along the lesser tuberosity.  We then passed these with a free needle through the myotendinous junction.  These were then divided and loaded into 2 separate 4.75 mm swivel lock anchors for the biceps groove.  A #2 FiberWire was then used to close the rotator interval.  This was done in 45 of abduction and external rotation.  Patient's biceps tendon was abraded and fixed to the pectoralis major insertion with a #2 FiberWire in a figure-of-eight fashion.  The wound was once again copious bleed irrigated using antibiotic solution and then normal saline.  We evaluated once again for hemostasis.  The wound was closed in layers with a #2 FiberWire in the deltopectoral interval.  2-0 Vicryl  for the deep dermal layer and 3-0 Monocryl in a subcuticular fashion followed by Dermabond glue.  A standard sterile occlusive dressing was applied as well as a postoperative sling.   The patient tolerated the procedure well.  All counts were correct 2.  There were no intraoperative complications.  The patient was transferred to PACU in stable condition.   Disposition: The patient will be nonweightbearing to the operative extremity for proximally 4-6 weeks.  He will begin physical therapy in the outpatient setting.  Sling will be maintained at all times.  Return for wound check in approximately 2 weeks and then again at 6 weeks.  POSTOPERATIVE PLAN: Discharge home today in a sling with his Iceman.  I will see him back in 2 weeks with 2 views of the right shoulder AP and Scap Y.

## 2024-07-12 NOTE — Anesthesia Postprocedure Evaluation (Signed)
 Anesthesia Post Note  Patient: Alexander Mejia  Procedure(s) Performed: HEMIARTHROPLASTY, SHOULDER (Right: Shoulder)     Patient location during evaluation: PACU Anesthesia Type: Regional and General Level of consciousness: awake and alert Pain management: pain level controlled Vital Signs Assessment: post-procedure vital signs reviewed and stable Respiratory status: spontaneous breathing, nonlabored ventilation, respiratory function stable and patient connected to nasal cannula oxygen Cardiovascular status: blood pressure returned to baseline and stable Postop Assessment: no apparent nausea or vomiting Anesthetic complications: no   No notable events documented.  Last Vitals:  Vitals:   07/12/24 1530 07/12/24 1541  BP: 126/78 122/79  Pulse: 61 (!) 59  Resp: 16 14  Temp:  36.9 C  SpO2: 96% 97%    Last Pain:  Vitals:   07/12/24 1630  TempSrc:   PainSc: 4                  Alexander Mejia [name]

## 2024-07-12 NOTE — Discharge Instructions (Addendum)
 Orthopedic surgery discharge instructions:  -Maintain postoperative bandages until your follow-up appointment.  You may shower in 3 days but do not submerge underwater.  Please do not submerge underwater.  -Maintain your arm in sling at all times.  You should only remove for showering and getting dressed.  No lifting with the operative arm.  -For mild to moderate pain use Tylenol  and Advil in alternating fashion around-the-clock.  For breakthrough pain use oxycodone  as necessary.  -Please apply ice to the right shoulder for 20-30 minutes out of each hour that you are awake.  Do this around-the-clock for the first 3 days from surgery.  -Follow-up in 2 weeks for routine postoperative check.

## 2024-07-12 NOTE — Brief Op Note (Signed)
 07/12/2024  2:21 PM  PATIENT:  Alexander Mejia  58 y.o. male  PRE-OPERATIVE DIAGNOSIS:  Right shoulder osteoarthritis  POST-OPERATIVE DIAGNOSIS:  same  PROCEDURE:  Procedure(s): HEMIARTHROPLASTY, SHOULDER (Right)  SURGEON:  Surgeons and Role:    * [name], MD - Primary  PHYSICIAN ASSISTANT: Dayle Moores, PA-C  ANESTHESIA:   regional and general  EBL:  100 mL   BLOOD ADMINISTERED:none  DRAINS: none   LOCAL MEDICATIONS USED:  NONE  SPECIMEN:  No Specimen  DISPOSITION OF SPECIMEN:  N/A  COUNTS:  YES  TOURNIQUET:  * No tourniquets in log *  DICTATION: .Note written in EPIC  PLAN OF CARE: Discharge to home after PACU  PATIENT DISPOSITION:  PACU - hemodynamically stable.   Delay start of Pharmacological VTE agent (>24hrs) due to surgical blood loss or risk of bleeding: not applicable

## 2024-07-12 NOTE — Progress Notes (Signed)
   ORTHOPAEDIC H&P  REQUESTING PHYSICIAN: [name], MD  PCP:  Seabron Lenis, MD  Chief Complaint: Right shoulder end-stage osteoarthritis  HPI: Alexander Mejia is a 58 y.o. male who complains of right shoulder pain and stiffness consistent with diagnosis of osteoarthritis.  Here today for total shoulder replacement.  Past Medical History:  Diagnosis Date   Hypertension    Past Surgical History:  Procedure Laterality Date   APPENDECTOMY     roght rotator cuff surgery      Social History   Socioeconomic History   Marital status: Married    Spouse name: Not on file   Number of children: Not on file   Years of education: Not on file   Highest education level: Not on file  Occupational History   Not on file  Tobacco Use   Smoking status: Never   Smokeless tobacco: Never  Vaping Use   Vaping status: Never Used  Substance and Sexual Activity   Alcohol  use: Never   Drug use: Never   Sexual activity: Not on file  Other Topics Concern   Not on file  Social History Narrative   Not on file   Social Drivers of Health   Financial Resource Strain: Not on file  Food Insecurity: Not on file  Transportation Needs: Not on file  Physical Activity: Not on file  Stress: Not on file  Social Connections: Unknown (02/25/2022)   Received from [facility]   Social Network    Social Network: Not on file   History reviewed. No pertinent family history. No Known Allergies Prior to Admission medications   Medication Sig Start Date End Date Taking? Authorizing Provider  amLODipine (NORVASC) 10 MG tablet Take 10 mg by mouth daily. 08/05/22  Yes [provider]  cyclobenzaprine (FLEXERIL) 10 MG tablet Take 10 mg by mouth 3 (three) times daily as needed for muscle spasms. 04/30/24  Yes [provider]  ibuprofen (ADVIL) 800 MG tablet Take 800 mg by mouth every 8 (eight) hours as needed for moderate pain (pain score 4-6). 12/02/19  Yes [provider]   No results found.  Positive ROS: All other systems have been reviewed and were otherwise negative with the exception of those mentioned in the HPI and as above.  Physical Exam: General: Alert, no acute distress Cardiovascular: No pedal edema Respiratory: No cyanosis, no use of accessory musculature GI: No organomegaly, abdomen is soft and non-tender Skin: No lesions in the area of chief complaint Neurologic: Sensation intact distally Psychiatric: Patient is competent for consent with normal mood and affect Lymphatic: No axillary or cervical lymphadenopathy  MUSCULOSKELETAL: Right upper extremity warm and well-perfused with no open wounds or lesions.  Neurovascular intact throughout.  Assessment: 1.  End-stage osteoarthritis right shoulder  Plan: Plan to proceed today with anatomic total shoulder arthroplasty assuming intact rotator cuff musculature.  We discussed the risk benefits of the procedure in detail including but not limited to bleeding, infection, damage to surrounding nerves and vessels, fracture, dislocation, rotator cuff failure, need for revision surgery as well as the risk of anesthesia.  He is provided informed consent.   Plan for discharge home postop from PACU.    Selinda Dover Sharl, MD Cell [phone]    07/12/2024 9:53 AM

## 2024-07-12 NOTE — Transfer of Care (Signed)
 Immediate Anesthesia Transfer of Care Note  Patient: Alexander Mejia  Procedure(s) Performed: HEMIARTHROPLASTY, SHOULDER (Right: Shoulder)  Patient Location: PACU  Anesthesia Type:GA combined with regional for post-op pain  Level of Consciousness: drowsy  Airway & Oxygen Therapy: Patient Spontanous Breathing and Patient connected to face mask  Post-op Assessment: Report given to RN and Post -op Vital signs reviewed and stable  Post vital signs: Reviewed and stable  Last Vitals:  Vitals Value Taken Time  BP 148/102 07/12/24 14:49  Temp    Pulse 74 07/12/24 14:52  Resp 23 07/12/24 14:52  SpO2 100 % 07/12/24 14:52  Vitals shown include unfiled device data.  Last Pain:  Vitals:   07/12/24 1155  TempSrc:   PainSc: Asleep      Patients Stated Pain Goal: 1 (07/12/24 0936)  Complications: No notable events documented.

## 2024-07-12 NOTE — Anesthesia Procedure Notes (Signed)
 Procedure Name: Intubation Date/Time: 07/12/2024 12:43 PM  Performed by: Judythe Tanda Aran, CRNAPre-anesthesia Checklist: Patient identified, Emergency Drugs available, Suction available and Patient being monitored Patient Re-evaluated:Patient Re-evaluated prior to induction Oxygen Delivery Method: Circle system utilized Preoxygenation: Pre-oxygenation with 100% oxygen Induction Type: IV induction Ventilation: Mask ventilation without difficulty Laryngoscope Size: 2 and Miller Grade View: Grade I Tube type: Oral Tube size: 7.5 mm Number of attempts: 1 Airway Equipment and Method: Stylet Placement Confirmation: ETT inserted through vocal cords under direct vision, positive ETCO2 and breath sounds checked- equal and bilateral Secured at: 22 cm Tube secured with: Tape Dental Injury: Teeth and Oropharynx as per pre-operative assessment

## 2024-07-15 ENCOUNTER — Encounter (HOSPITAL_COMMUNITY): Payer: Self-pay | Admitting: Orthopedic Surgery

## 2024-07-16 NOTE — H&P (Signed)
 Signed     Expand All Collapse All     ORTHOPAEDIC H&P   REQUESTING PHYSICIAN: [name], MD   PCP:  Seabron Lenis, MD   Chief Complaint: Right shoulder end-stage osteoarthritis   HPI: Alexander Mejia is a 58 y.o. male who complains of right shoulder pain and stiffness consistent with diagnosis of osteoarthritis.  Here today for total shoulder replacement.       Past Medical History:  Diagnosis Date   Hypertension               Past Surgical History:  Procedure Laterality Date   APPENDECTOMY       roght rotator cuff surgery             Social History         Socioeconomic History   Marital status: Married      Spouse name: Not on file   Number of children: Not on file   Years of education: Not on file   Highest education level: Not on file  Occupational History   Not on file  Tobacco Use   Smoking status: Never   Smokeless tobacco: Never  Vaping Use   Vaping status: Never Used  Substance and Sexual Activity   Alcohol  use: Never   Drug use: Never   Sexual activity: Not on file  Other Topics Concern   Not on file  Social History Narrative   Not on file    Social Drivers of Health        Financial Resource Strain: Not on file  Food Insecurity: Not on file  Transportation Needs: Not on file  Physical Activity: Not on file  Stress: Not on file  Social Connections: Unknown (02/25/2022)    Received from [facility]    Social Network     Social Network: Not on file    History reviewed. No pertinent family history.     Allergies  No Known Allergies          Prior to Admission medications   Medication Sig Start Date End Date Taking? Authorizing Provider  amLODipine (NORVASC) 10 MG tablet Take 10 mg by mouth daily. 08/05/22   Yes [provider]  cyclobenzaprine (FLEXERIL) 10 MG tablet Take 10 mg by mouth 3 (three) times daily as needed for muscle spasms. 04/30/24   Yes [provider]  ibuprofen (ADVIL) 800 MG  tablet Take 800 mg by mouth every 8 (eight) hours as needed for moderate pain (pain score 4-6). 12/02/19   Yes [provider]    Imaging Results (Last 48 hours)  No results found.     Positive ROS: All other systems have been reviewed and were otherwise negative with the exception of those mentioned in the HPI and as above.   Physical Exam: General: Alert, no acute distress Cardiovascular: No pedal edema Respiratory: No cyanosis, no use of accessory musculature GI: No organomegaly, abdomen is soft and non-tender Skin: No lesions in the area of chief complaint Neurologic: Sensation intact distally Psychiatric: Patient is competent for consent with normal mood and affect Lymphatic: No axillary or cervical lymphadenopathy   MUSCULOSKELETAL: Right upper extremity warm and well-perfused with no open wounds or lesions.  Neurovascular intact throughout.   Assessment: 1.  End-stage osteoarthritis right shoulder   Plan: Plan to proceed today with anatomic total shoulder arthroplasty assuming intact rotator cuff musculature.  We discussed the risk benefits of the procedure in detail including but not limited  to bleeding, infection, damage to surrounding nerves and vessels, fracture, dislocation, rotator cuff failure, need for revision surgery as well as the risk of anesthesia.  He is provided informed consent.     Plan for discharge home postop from PACU.       Selinda Belvie Gosling, MD Cell [phone]      07/12/2024 9:53 AM

## 2024-07-30 ENCOUNTER — Other Ambulatory Visit: Payer: Self-pay | Admitting: Orthopedic Surgery

## 2024-07-30 DIAGNOSIS — Z4789 Encounter for other orthopedic aftercare: Secondary | ICD-10-CM

## 2024-08-22 ENCOUNTER — Ambulatory Visit
Admission: RE | Admit: 2024-08-22 | Discharge: 2024-08-22 | Disposition: A | Discharge status: Home / Self Care | Source: Ambulatory Visit | Attending: Orthopedic Surgery | Admitting: Orthopedic Surgery

## 2024-08-22 DIAGNOSIS — Z4789 Encounter for other orthopedic aftercare: Secondary | ICD-10-CM
